# Patient Record
Sex: Male | Born: 2000 | Hispanic: Yes | Marital: Single | Smoking: Never smoker
Health system: Southern US, Community
[De-identification: ages and names within clinical notes are randomized; demographics above are authoritative.]

## PROBLEM LIST (undated history)

## (undated) ENCOUNTER — Ambulatory Visit: Discharge status: Absent without leave | Payer: Medicaid Other

## (undated) DIAGNOSIS — J45909 Unspecified asthma, uncomplicated: Secondary | ICD-10-CM

## (undated) DIAGNOSIS — R011 Cardiac murmur, unspecified: Secondary | ICD-10-CM

---
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
## 2022-02-03 ENCOUNTER — Other Ambulatory Visit: Payer: Self-pay

## 2022-02-03 ENCOUNTER — Ambulatory Visit
Admission: EM | Admit: 2022-02-03 | Discharge: 2022-02-03 | Disposition: A | Discharge status: Home / Self Care | Payer: Medicaid Other

## 2022-02-03 DIAGNOSIS — R112 Nausea with vomiting, unspecified: Secondary | ICD-10-CM

## 2022-02-03 DIAGNOSIS — R197 Diarrhea, unspecified: Secondary | ICD-10-CM

## 2022-02-03 DIAGNOSIS — A084 Viral intestinal infection, unspecified: Secondary | ICD-10-CM | POA: Diagnosis not present

## 2022-02-03 MED ORDER — ONDANSETRON 4 MG PO TBDP
4.0000 mg | ORAL_TABLET | Freq: Three times a day (TID) | ORAL | 0 refills | Status: DC | PRN
Start: 2022-02-03 — End: 2023-05-12

## 2022-02-03 NOTE — Discharge Instructions (Signed)
It appears that you have a viral stomach virus.  This should self resolve in the next few days.  A nausea medication has been prescribed for you.  Please increase clear oral fluid intake to prevent dehydration.  Go to the hospital if symptoms persist or worsen.

## 2022-02-03 NOTE — ED Triage Notes (Signed)
Two day h/o emesis and abdominal pain, one day of decreased appetite, chills and fever. Tmax 101.0. Onset last night of HA.  Also confirms diarrhea. No meds taken. Pt reports last weekend his family had a stomach bug.

## 2022-02-03 NOTE — ED Provider Notes (Signed)
[facility]    CSN: [id number] Arrival date & time: 02/03/22  1049      History   Chief Complaint Chief Complaint  Patient presents with   Emesis    HPI Hulon Yong Grieser is a 21 y.o. male.   Patient presents with a 2-day history of nausea, vomiting, diarrhea, stomach ache, chills, fever, decreased appetite.  Tmax at home was 101.  He reports that his family members have recently had a stomach virus as well.  Denies blood in stool or emesis.  Denies any associated upper respiratory symptoms.  Patient has been able to eat and drink but nausea is exacerbated when this happens.   Emesis  Past Medical History:  Diagnosis Date   Asthma    Heart murmur     There are no problems to display for this patient.   History reviewed. No pertinent surgical history.     Home Medications    Prior to Admission medications   Medication Sig Start Date End Date Taking? Authorizing Provider  ondansetron (ZOFRAN-ODT) 4 MG disintegrating tablet Take 1 tablet (4 mg total) by mouth every 8 (eight) hours as needed for nausea or vomiting. 02/03/22  Yes [employer], Rolly Salter E, FNP  ibuprofen (ADVIL,MOTRIN) 600 MG tablet Take 1 tablet (600 mg total) by mouth every 6 (six) hours as needed for moderate pain. 03/02/17   Evon Slack, PA-C  loratadine (CLARITIN) 10 MG tablet Take 10 mg by mouth daily. 01/27/22   [provider]    Family History Family History  Problem Relation Age of Onset   Healthy Mother    Healthy Father     Social History Social History   Tobacco Use   Smoking status: Never   Smokeless tobacco: Never  Substance Use Topics   Alcohol use: No   Drug use: No     Allergies   Patient has no known allergies.   Review of Systems Review of Systems Per HPI  Physical Exam Triage Vital Signs ED Triage Vitals  Enc Vitals Group     BP 02/03/22 1121 (!) 150/82     Pulse Rate 02/03/22 1121 65     Resp 02/03/22 1121 18     Temp 02/03/22 1121  98.8 F (37.1 C)     Temp Source 02/03/22 1121 Oral     SpO2 02/03/22 1121 98 %     Weight --      Height --      Head Circumference --      Peak Flow --      Pain Score 02/03/22 1123 6     Pain Loc --      Pain Edu? --      Excl. in GC? --    No data found.  Updated Vital Signs BP (!) 150/82 (BP Location: Left Arm)    Pulse 65    Temp 98.8 F (37.1 C) (Oral)    Resp 18    SpO2 98%   Visual Acuity Right Eye Distance:   Left Eye Distance:   Bilateral Distance:    Right Eye Near:   Left Eye Near:    Bilateral Near:     Physical Exam Constitutional:      General: He is not in acute distress.    Appearance: Normal appearance. He is not toxic-appearing or diaphoretic.  HENT:     Head: Normocephalic and atraumatic.     Mouth/Throat:     Pharynx: No posterior oropharyngeal erythema.  Eyes:  Extraocular Movements: Extraocular movements intact.     Conjunctiva/sclera: Conjunctivae normal.  Cardiovascular:     Rate and Rhythm: Normal rate and regular rhythm.     Pulses: Normal pulses.     Heart sounds: Normal heart sounds.  Pulmonary:     Effort: Pulmonary effort is normal. No respiratory distress.     Breath sounds: Normal breath sounds.  Abdominal:     General: Abdomen is flat. Bowel sounds are normal. There is no distension.     Palpations: Abdomen is soft.     Tenderness: There is no abdominal tenderness.  Neurological:     General: No focal deficit present.     Mental Status: He is alert and oriented to person, place, and time. Mental status is at baseline.  Psychiatric:        Mood and Affect: Mood normal.        Behavior: Behavior normal.        Thought Content: Thought content normal.        Judgment: Judgment normal.     UC Treatments / Results  Labs (all labs ordered are listed, but only abnormal results are displayed) Labs Reviewed - No data to display  EKG   Radiology No results found.  Procedures Procedures (including critical care  time)  Medications Ordered in UC Medications - No data to display  Initial Impression / Assessment and Plan / UC Course  I have reviewed the triage vital signs and the nursing notes.  Pertinent labs & imaging results that were available during my care of the patient were reviewed by me and considered in my medical decision making (see chart for details).     Patient's symptoms appear viral in etiology.  Suspect viral gastroenteritis given patient's close exposure.  Will treat with ondansetron to take as needed for nausea.  Patient to increase clear oral fluid intake to prevent dehydration.  No signs of dehydration on exam at this time.  Discussed return precautions.  Patient verbalized understanding and was agreeable with plan. Final Clinical Impressions(s) / UC Diagnoses   Final diagnoses:  Viral gastroenteritis  Nausea vomiting and diarrhea     Discharge Instructions      It appears that you have a viral stomach virus.  This should self resolve in the next few days.  A nausea medication has been prescribed for you.  Please increase clear oral fluid intake to prevent dehydration.  Go to the hospital if symptoms persist or worsen.    ED Prescriptions     Medication Sig Dispense Auth. Provider   ondansetron (ZOFRAN-ODT) 4 MG disintegrating tablet Take 1 tablet (4 mg total) by mouth every 8 (eight) hours as needed for nausea or vomiting. 20 tablet [place], Acie Fredrickson, [place]      PDMP not reviewed this encounter.   Gustavus Bryant, [place] 02/03/22 1136

## 2023-05-10 ENCOUNTER — Encounter: Payer: Self-pay | Admitting: Allergy & Immunology

## 2023-05-10 ENCOUNTER — Ambulatory Visit (INDEPENDENT_AMBULATORY_CARE_PROVIDER_SITE_OTHER): Payer: Medicaid Other | Admitting: Allergy & Immunology

## 2023-05-10 ENCOUNTER — Other Ambulatory Visit: Payer: Self-pay

## 2023-05-10 VITALS — BP 120/74 | HR 91 | Temp 98.4°F | Resp 18 | Ht 67.32 in | Wt 194.6 lb

## 2023-05-10 DIAGNOSIS — Q231 Congenital insufficiency of aortic valve: Secondary | ICD-10-CM | POA: Diagnosis not present

## 2023-05-10 DIAGNOSIS — J302 Other seasonal allergic rhinitis: Secondary | ICD-10-CM | POA: Diagnosis not present

## 2023-05-10 DIAGNOSIS — J3089 Other allergic rhinitis: Secondary | ICD-10-CM

## 2023-05-10 NOTE — Progress Notes (Signed)
NEW PATIENT  Date of Service/Encounter:  05/10/23  Consult requested by: Chrys Racer, MD   Assessment:   Seasonal and perennial allergic rhinitis - Plan: Allergy Test  Plan/Recommendations:    There are no Patient Instructions on file for this visit.   {Blank single:19197::"This note in its entirety was forwarded to the Provider who requested this consultation."}  Subjective:   Pedro Soto is a 22 y.o. male presenting today for evaluation of  Chief Complaint  Patient presents with  . Allergic Rhinitis     Cat and pollen allergies - takes Claritin     Pedro Soto has a history of the following: There are no problems to display for this patient.   History obtained from: chart review and {Persons; PED relatives w/patient:19415::"patient"}.  Pedro Soto was referred by Chrys Racer, MD.     Pedro Soto is a 22 y.o. male presenting for {Blank single:19197::"a food challenge","a drug challenge","skin testing","a sick visit","an evaluation of ***","a follow up visit"}.  Allergic Rhinitis Symptom History: He has some issues with allergy season. He started having problems for years. He does report that he is allergic to cats. His roommates have cats now. He has had worsening symptoms since that time. They got the cat six months ago. Cat does not go into his room. He sometimes uses Claritin but mostly uses Benadryl which seems to work the best. He might have been tested for allergies years ago where he grew up in [place].   He did buy a nasal spray to try which did help. This was [facility] and did not smell like flower. This was fluticasone after reviewing the medications.   He does get sinus infections twice per year. Typically he goes to Urgent Care for these.    He did have asthma growing up. He did have food allergies in the past but eats everything now without a problem.   He has a history of bicuspid aortic valve that was  diagnosed years who when he was an infant. 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 He was not having symptoms and felt fine, but the echocardiogram looked bad. He sees them once every three years or so.   ***Otherwise, there is no history of other atopic diseases, including {Blank multiple:19196:o:"asthma","food allergies","drug allergies","environmental allergies","stinging insect allergies","eczema","urticaria","contact dermatitis"}. There is no significant infectious history. ***Vaccinations are up to date.    Past Medical History: There are no problems to display for this patient.   Medication List:  Allergies as of 05/10/2023   No Known Allergies      Medication List        Accurate as of May 10, 2023  5:08 PM. If you have any questions, ask your nurse or doctor.          ibuprofen 600 MG tablet Commonly known as: ADVIL Take 1 tablet (600 mg total) by mouth every 6 (six) hours as needed for moderate pain.   loratadine 10 MG tablet Commonly known as: CLARITIN Take 10 mg by mouth daily.   ondansetron 4 MG disintegrating tablet Commonly known as: ZOFRAN-ODT Take 1 tablet (4 mg total) by mouth every 8 (eight) hours as needed for nausea or vomiting.        Birth History: {Blank single:19197::"non-contributory","born premature and spent time in the NICU","born at term without complications"}  Developmental History: Pedro Soto has met all milestones on time. He has required no {Blank multiple:19196:a:"speech therapy","occupational therapy","physical therapy"}. ***non-contributory  Past Surgical  History: History reviewed. No pertinent surgical history.   Family History: Family History  Problem Relation Age of Onset  . Healthy Mother   . Healthy Father      Social History: Pedro Soto lives at home with ***.    ROS     Objective:   Blood pressure 120/74, pulse 91, temperature 98.4 F (36.9 C), resp. rate 18, height 5' 7.32" (1.71  m), weight 194 lb 9.6 oz (88.3 kg), SpO2 96 %. Body mass index is 30.19 kg/m.     Physical Exam   Diagnostic studies: {Blank single:19197::"none","deferred due to recent antihistamine use","deferred due to insurance stipulations that refuse to pay for testing at initial visits, making it more difficult for patients to get the care they need","labs sent instead"," "}  Spirometry: {Blank single:19197::"results normal (FEV1: ***%, FVC: ***%, FEV1/FVC: ***%)","results abnormal (FEV1: ***%, FVC: ***%, FEV1/FVC: ***%)"}.    {Blank single:19197::"Spirometry consistent with mild obstructive disease","Spirometry consistent with moderate obstructive disease","Spirometry consistent with severe obstructive disease","Spirometry consistent with possible restrictive disease","Spirometry consistent with mixed obstructive and restrictive disease","Spirometry uninterpretable due to technique","Spirometry consistent with normal pattern"}. {Blank single:19197::"Albuterol/Atrovent nebulizer","Xopenex/Atrovent nebulizer","Albuterol nebulizer","Albuterol four puffs via MDI","Xopenex four puffs via MDI"} treatment given in clinic with {Blank single:19197::"significant improvement in FEV1 per ATS criteria","significant improvement in FVC per ATS criteria","significant improvement in FEV1 and FVC per ATS criteria","improvement in FEV1, but not significant per ATS criteria","improvement in FVC, but not significant per ATS criteria","improvement in FEV1 and FVC, but not significant per ATS criteria","no improvement"}.  Allergy Studies: {Blank single:19197::"none","labs sent instead"," "}   Airborne Adult Perc - 05/10/23 1033     Time Antigen Placed 1015    Allergen Manufacturer Waynette Buttery    Location Back    Number of Test 59    1. Control-Buffer 50% Glycerol Negative    2. Control-Histamine 1 mg/ml 2+    3. Albumin saline Negative    4. Bahia 3+    5. [place] 3+    6. Johnson Negative    7. [place] Blue 4+    8.  Meadow Fescue 4+    9. Perennial Rye 4+    10. Sweet Vernal 3+    11. Timothy 4+    12. Cocklebur Negative    13. Burweed Marshelder Negative    14. Ragweed, short Negative    15. Ragweed, Giant Negative    16. Plantain,  English Negative    17. Lamb's Quarters Negative    18. Sheep Sorrell Negative    19. Rough Pigweed Negative    20. Marsh Elder, Rough Negative    21. Mugwort, Common Negative    22. Ash mix 3+    23. Birch mix Negative    24. Beech American Negative    25. Box, Elder Negative    26. Cedar, red Negative    27. Cottonwood, [place] Negative    28. Elm mix Negative    29. Hickory 3+    30. Maple mix 4+    31. Oak, [place] mix 2+    32. Pecan Pollen 2+    33. Pine mix Negative    34. Sycamore Eastern Negative    35. Walnut, Black Pollen Negative    36. Alternaria alternata 3+    37. Cladosporium Herbarum Negative    38. Aspergillus mix Negative    39. Penicillium mix Negative    40. Bipolaris sorokiniana (Helminthosporium) Negative    41. Drechslera spicifera (Curvularia) Negative    42. Mucor plumbeus Negative    43.  Fusarium moniliforme Negative    44. Aureobasidium pullulans (pullulara) Negative    45. Rhizopus oryzae Negative    46. Botrytis cinera Negative    47. Epicoccum nigrum Negative    48. Phoma betae Negative    49. Candida Albicans Negative    50. Trichophyton mentagrophytes Negative    51. Mite, D Farinae  5,000 AU/ml 2+    52. Mite, D Pteronyssinus  5,000 AU/ml 4+    53. Cat Hair 10,000 BAU/ml 4+    54.  Dog Epithelia Negative    55. Mixed Feathers Negative    56. Horse Epithelia Negative    57. Cockroach, German Negative    58. Mouse Negative    59. Tobacco Leaf Negative             Intradermal - 05/10/23 1034     Time Antigen Placed 1045    Allergen Manufacturer Waynette Buttery    Location Arm    Number of Test 9             {Blank single:19197::"Allergy testing results were read and interpreted by myself, documented by  clinical staff."," "}         Malachi Bonds, MD [facility]

## 2023-05-12 ENCOUNTER — Encounter: Payer: Self-pay | Admitting: Allergy & Immunology

## 2023-05-12 ENCOUNTER — Telehealth: Payer: Self-pay

## 2023-05-12 ENCOUNTER — Ambulatory Visit: Payer: Medicaid Other | Admitting: Allergy & Immunology

## 2023-05-12 NOTE — Telephone Encounter (Signed)
LVM to return call to office

## 2023-05-23 NOTE — Telephone Encounter (Signed)
Call pt to reschedule his intradermal visit lvm to call the [place] office back.
